# Patient Record
Sex: Female | Born: 1970 | Hispanic: No | Marital: Married
Health system: Northeastern US, Academic
[De-identification: ages and names within clinical notes are randomized; demographics above are authoritative.]

---

## 2013-01-03 IMAGING — CT Abdomen^02_ROUTINE_ABD_PEL (Adult)
1 of 2 series · 15 of 32 positions shown, 19 images · non-contrast
Comparison: None

Final Report

EXAM: CT OF ABDOMEN AND PELVIS
HISTORY: ?Visit reason:  Lt lower quadrant pain R/O hernia;
TECHNIQUE: Helical images images were obtained after the 
administration of oral and intravenous contrast from the 
diaphragmatic domes to the inferior margin of the pubic symphysis.

[Series 3: abdomenpelvis 2.5 b41s · axial · 0.67mm/px · z∈[-492,-59]mm · 15 of 393 slices shown, 19 images]
[im 16/393  soft-tissue]
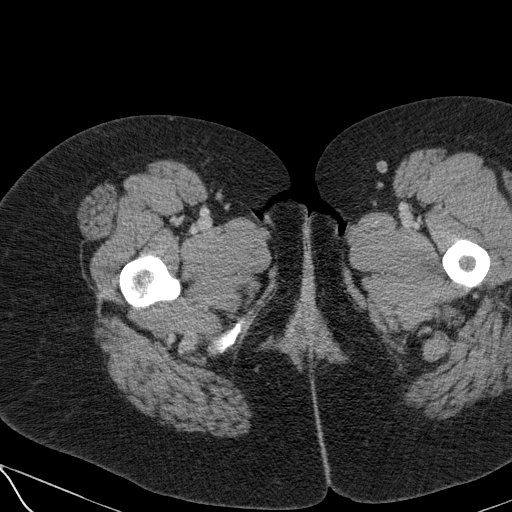
[im 16/393  bone]
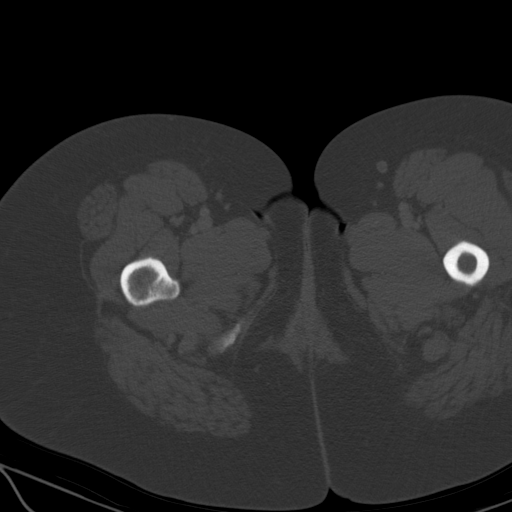
[im 48/393  soft-tissue]
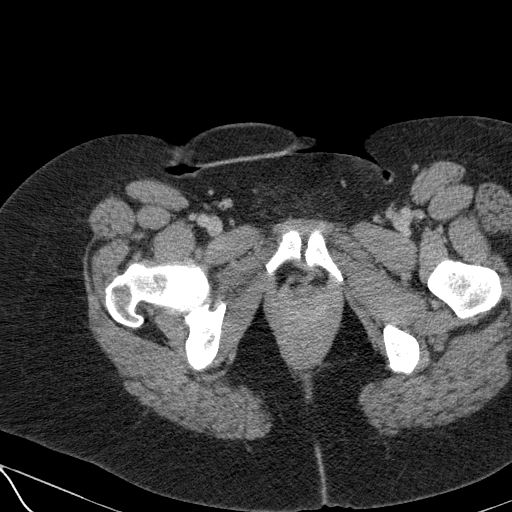
[im 79/393  soft-tissue]
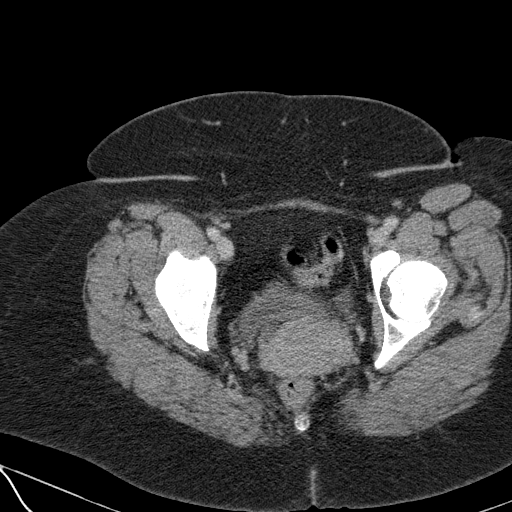
[im 110/393  soft-tissue]
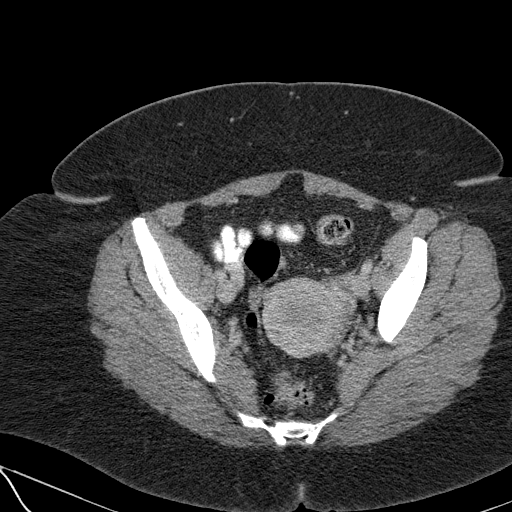
[im 142/393  soft-tissue]
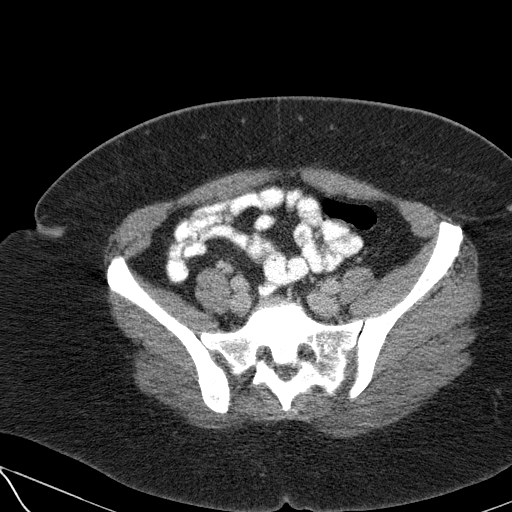
[im 173/393  soft-tissue]
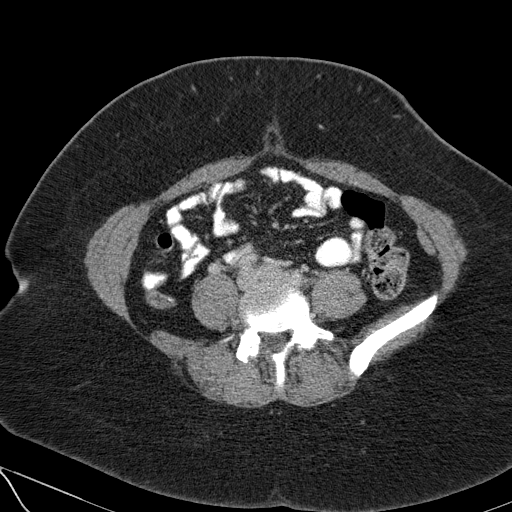
[im 204/393  soft-tissue]
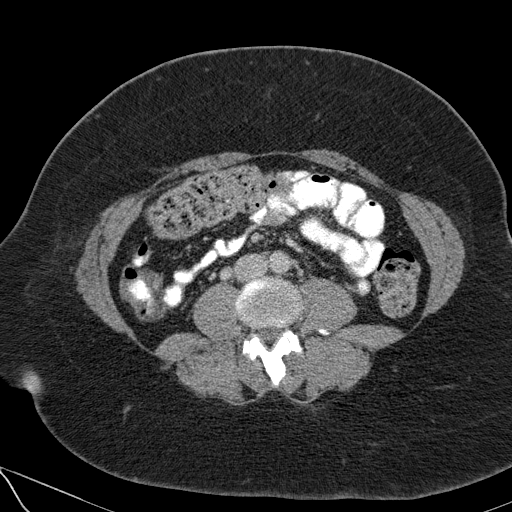
[im 220/393  soft-tissue]
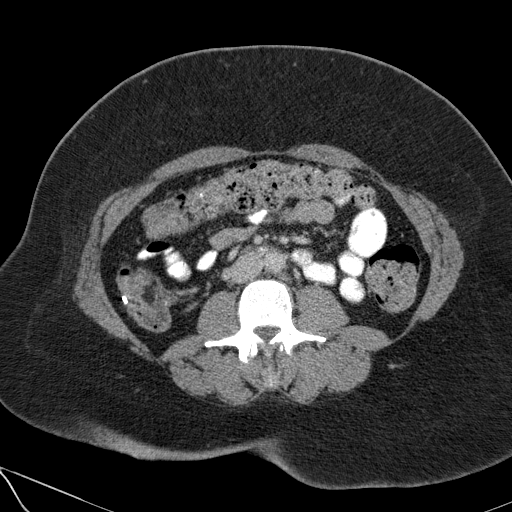
[im 251/393  soft-tissue]
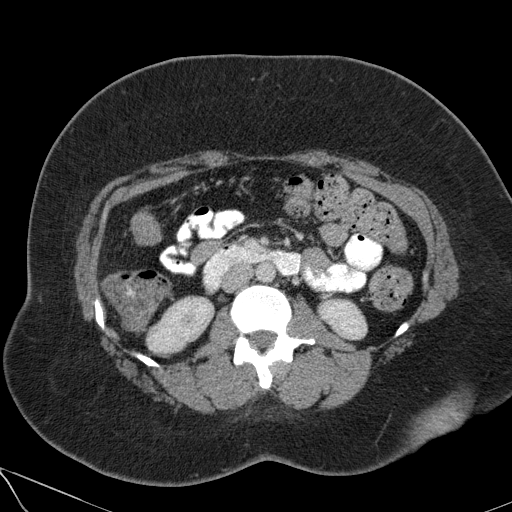
[im 251/393  bone]
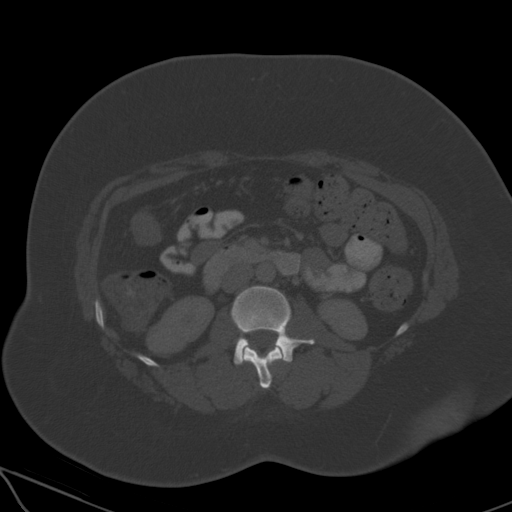
[im 283/393  soft-tissue]
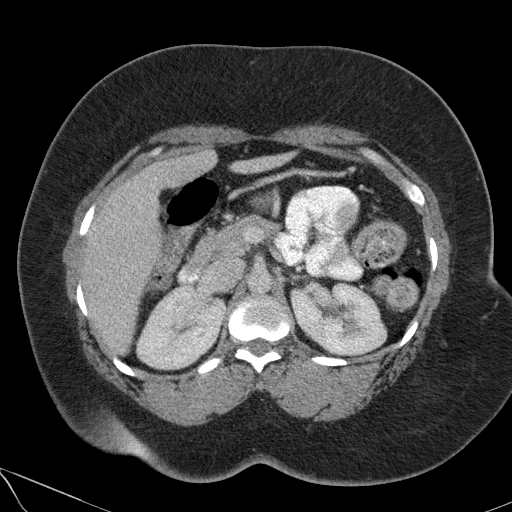
[im 314/393  soft-tissue]
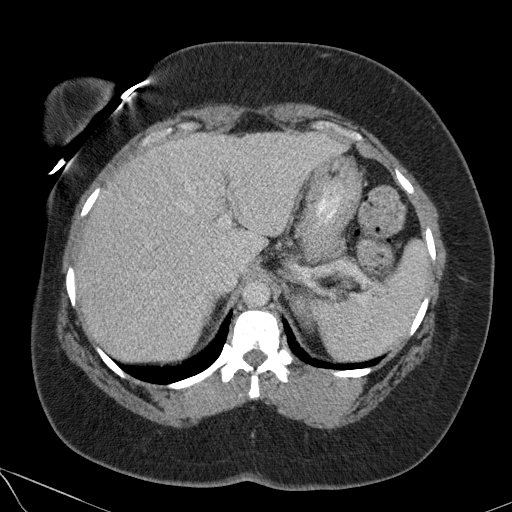
[im 330/393  lung]
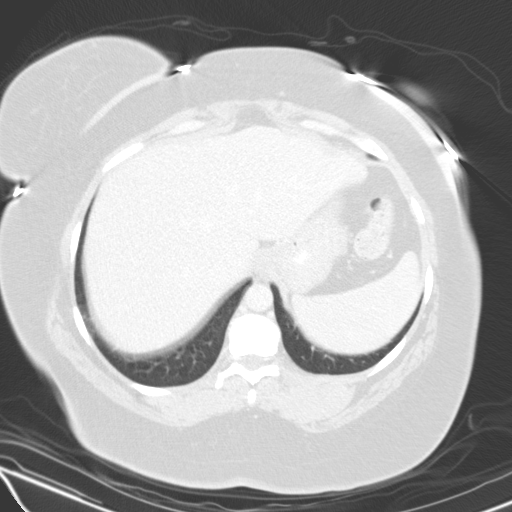
[im 345/393  soft-tissue]
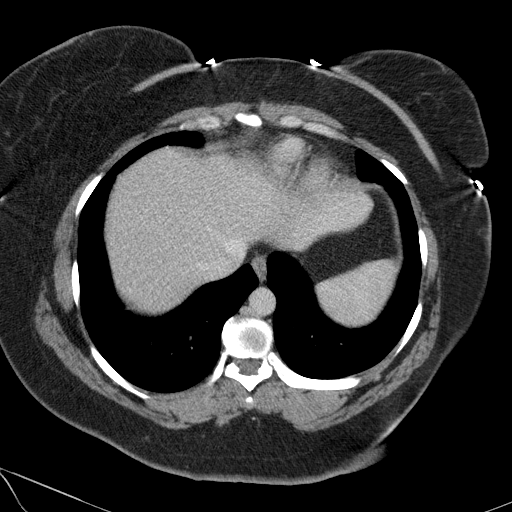
[im 345/393  lung]
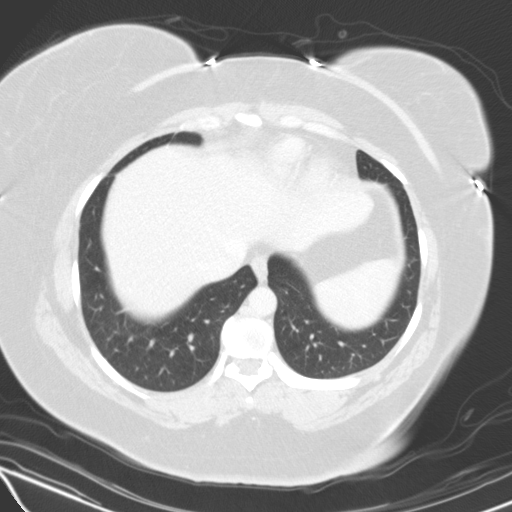
[im 361/393  lung]
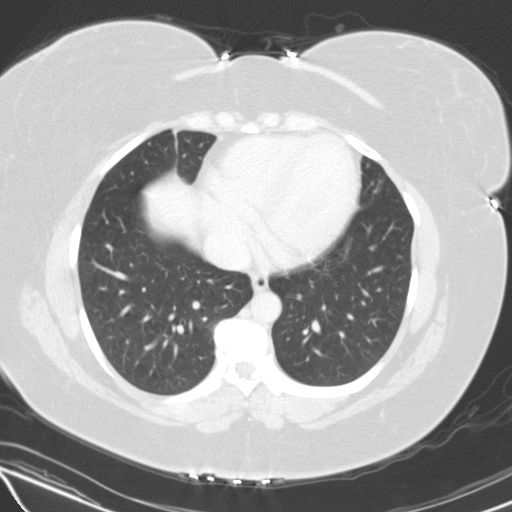
[im 377/393  soft-tissue]
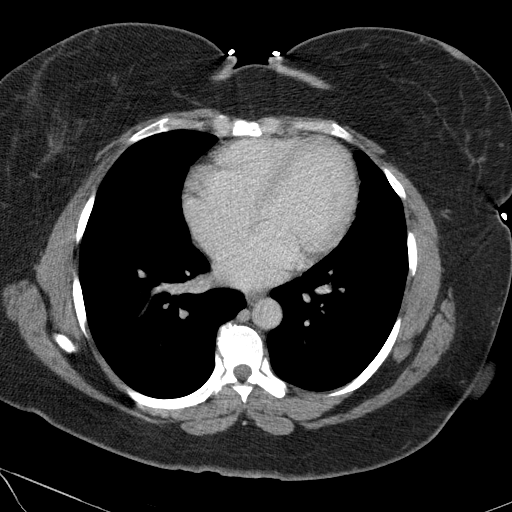
[im 377/393  lung]
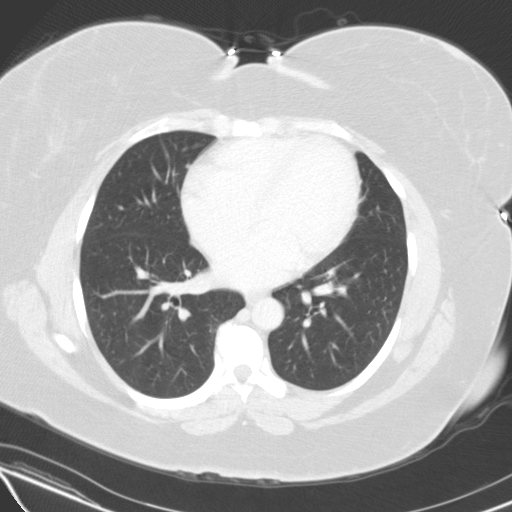

[15 of 32 positions shown; findings below may reference images not displayed]

FINDINGS: Liver: There is no evidence of a solid mass.  The liver has a 

homogeneous architecture. 

Spleen: Unremarkable

Pancreas: Normal in size and architecture.  The pancreatic duct is 

not dilated.

Biliary tree: Cholecystectomy otherwise Unremarkable

Adrenals: Normal in size and architecture.

Kidneys: Normal in size with no evidence of hydronephrosis or solid 

renal mass.  

Lymphadenopathy: None

Peritoneal cavity: No free intraperitoneal fluid or air

Abdominal aorta: Normal in size

2 centimeter left ovarian cyst, 1.5 centimeter right ovarian cyst 

most likely physiologic.

The uterus is unremarkable.

Gastrointestinal tract: No bowel dilatation, focal bowel wall 

thickening, or adjacent inflammatory changes are seen in the 

visualized portions of the gastrointestinal tract.  There is no 

evidence of a hernia
IMPRESSION: Negative study.

## 2020-10-15 ENCOUNTER — Encounter: Admit: 2020-10-15 | Payer: PRIVATE HEALTH INSURANCE | Primary: Internal Medicine

## 2020-10-15 ENCOUNTER — Inpatient Hospital Stay: Admit: 2020-10-15 | Discharge: 2020-10-15 | Payer: Medicaid (Managed Care) | Attending: Emergency Medicine

## 2020-10-15 ENCOUNTER — Emergency Department: Admit: 2020-10-15 | Payer: Medicaid (Managed Care) | Primary: Internal Medicine

## 2020-10-15 DIAGNOSIS — K219 Gastro-esophageal reflux disease without esophagitis: Secondary | ICD-10-CM

## 2020-10-15 DIAGNOSIS — Z79899 Other long term (current) drug therapy: Secondary | ICD-10-CM

## 2020-10-15 DIAGNOSIS — R110 Nausea: Secondary | ICD-10-CM

## 2020-10-15 DIAGNOSIS — R1084 Generalized abdominal pain: Secondary | ICD-10-CM

## 2020-10-15 LAB — CBC WITH AUTO DIFFERENTIAL
BKR WAM ABSOLUTE IMMATURE GRANULOCYTES.: 0.04 x 1000/ÂµL (ref 0.00–0.30)
BKR WAM ABSOLUTE LYMPHOCYTE COUNT.: 1.68 x 1000/ÂµL (ref 0.60–3.70)
BKR WAM ABSOLUTE NRBC (2 DEC): 0 x 1000/ÂµL (ref 0.00–1.00)
BKR WAM ANALYZER ANC: 5.39 x 1000/ÂµL (ref 2.00–7.60)
BKR WAM BASOPHIL ABSOLUTE COUNT.: 0.02 x 1000/ÂµL (ref 0.00–1.00)
BKR WAM BASOPHILS: 0.3 % (ref 0.0–1.4)
BKR WAM EOSINOPHIL ABSOLUTE COUNT.: 0.03 x 1000/ÂµL (ref 0.00–1.00)
BKR WAM EOSINOPHILS: 0.4 % (ref 0.0–5.0)
BKR WAM HEMATOCRIT (2 DEC): 31.7 % — ABNORMAL LOW (ref 35.00–45.00)
BKR WAM HEMOGLOBIN: 10 g/dL — ABNORMAL LOW (ref 11.7–15.5)
BKR WAM IMMATURE GRANULOCYTES: 0.5 % (ref 0.0–1.0)
BKR WAM LYMPHOCYTES: 21.8 % (ref 17.0–50.0)
BKR WAM MCH (PG): 24.5 pg — ABNORMAL LOW (ref 27.0–33.0)
BKR WAM MCHC: 31.5 g/dL (ref 31.0–36.0)
BKR WAM MCV: 77.7 fL — ABNORMAL LOW (ref 80.0–100.0)
BKR WAM MONOCYTE ABSOLUTE COUNT.: 0.55 x 1000/ÂµL (ref 0.00–1.00)
BKR WAM MONOCYTES: 7.1 % (ref 4.0–12.0)
BKR WAM MPV: 10.1 fL (ref 8.0–12.0)
BKR WAM NEUTROPHILS: 69.9 % (ref 39.0–72.0)
BKR WAM NUCLEATED RED BLOOD CELLS: 0 % (ref 0.0–1.0)
BKR WAM PLATELETS: 285 x1000/ÂµL (ref 150–420)
BKR WAM RDW-CV: 15.5 % — ABNORMAL HIGH (ref 11.0–15.0)
BKR WAM RED BLOOD CELL COUNT.: 4.08 M/ÂµL (ref 4.00–6.00)
BKR WAM WHITE BLOOD CELL COUNT: 7.7 x1000/ÂµL (ref 4.0–11.0)

## 2020-10-15 LAB — COMPREHENSIVE METABOLIC PANEL
BKR A/G RATIO: 1
BKR ALANINE AMINOTRANSFERASE (ALT): 17 U/L (ref 12–78)
BKR ALBUMIN: 3.5 g/dL (ref 3.4–5.0)
BKR ALKALINE PHOSPHATASE: 96 U/L (ref 20–120)
BKR ANION GAP: 5 (ref 5–18)
BKR ASPARTATE AMINOTRANSFERASE (AST): 14 U/L (ref 5–37)
BKR AST/ALT RATIO: 0.8
BKR BILIRUBIN TOTAL: 0.3 mg/dL (ref 0.0–1.0)
BKR BLOOD UREA NITROGEN: 8 mg/dL (ref 8–25)
BKR BUN / CREAT RATIO: 10.3 (ref 8.0–25.0)
BKR CALCIUM: 9.1 mg/dL (ref 8.4–10.3)
BKR CHLORIDE: 108 mmol/L (ref 95–115)
BKR CO2: 27 mmol/L (ref 21–32)
BKR CREATININE: 0.78 mg/dL (ref 0.50–1.30)
BKR EGFR (AFR AMER): >60 mL/min/{1.73_m2} (ref >60)
BKR EGFR (NON AFRICAN AMERICAN): >60 mL/min/{1.73_m2} (ref >60)
BKR GLOBULIN: 3.4 g/dL
BKR GLUCOSE: 78 mg/dL (ref 70–100)
BKR OSMOLALITY CALCULATION: 277 mosm/kg (ref 275–295)
BKR POTASSIUM: 3.9 mmol/L (ref 3.5–5.1)
BKR PROTEIN TOTAL: 6.9 g/dL (ref 6.4–8.2)
BKR SODIUM: 140 mmol/L (ref 136–145)

## 2020-10-15 LAB — ZZZURINALYSIS WITH CULTURE REFLEX     (L Q)
BKR BILIRUBIN, UA: NEGATIVE
BKR BLOOD, UA: NEGATIVE
BKR GLUCOSE, UA: NEGATIVE
BKR KETONES, UA: NEGATIVE
BKR LEUKOCYTE ESTERASE, UA: NEGATIVE
BKR NITRITE, UA: NEGATIVE
BKR PH, UA: 7.5 (ref 5.5–7.5)
BKR PROTEIN, UA: NEGATIVE
BKR SPECIFIC GRAVITY, UA: 1.002 — ABNORMAL LOW (ref 1.005–1.030)
BKR UROBILINOGEN, UA: <2 EU/dL (ref <=2.0)

## 2020-10-15 LAB — LIPASE: BKR LIPASE: 99 U/L (ref 73–393)

## 2020-10-15 LAB — HCG, SERUM, QUALITATIVE (BH GH L LMW): BKR SERUM PREGNANCY-QUALITATIVE: NEGATIVE

## 2020-10-15 MED ORDER — ONDANSETRON HCL (PF) 4 MG/2 ML INJECTION SOLUTION
4 mg/2 mL | Freq: Once | INTRAVENOUS | Status: CP
Start: 2020-10-15 — End: ?
  Administered 2020-10-15: 16:00:00 4 mL via INTRAVENOUS

## 2020-10-15 MED ORDER — HYOSCYAMINE 0.125 MG SUBLINGUAL TABLET
0.125 mg | SUBLINGUAL | Status: DC | PRN
Start: 2020-10-15 — End: 2020-10-16
  Administered 2020-10-15: 19:00:00 0.125 mg via SUBLINGUAL

## 2020-10-15 MED ORDER — ONDANSETRON 4 MG DISINTEGRATING TABLET
4 mg | ORAL_TABLET | Freq: Four times a day (QID) | ORAL | 1 refills | Status: AC | PRN
Start: 2020-10-15 — End: ?

## 2020-10-15 MED ORDER — IOHEXOL 350 MG IODINE/ML INTRAVENOUS SOLUTION
350 mg iodine/mL | Freq: Once | INTRAVENOUS | Status: CP | PRN
Start: 2020-10-15 — End: ?
  Administered 2020-10-15: 17:00:00 350 mL via INTRAVENOUS

## 2020-10-15 MED ORDER — OMEPRAZOLE 40 MG CAPSULE,DELAYED RELEASE
40 mg | Freq: Every day | ORAL | Status: AC
Start: 2020-10-15 — End: ?

## 2020-10-15 MED ORDER — SODIUM CHLORIDE 0.9 % BOLUS (NEW BAG)
0.9 % | Freq: Once | INTRAVENOUS | Status: CP
Start: 2020-10-15 — End: ?
  Administered 2020-10-15: 16:00:00 0.9 mL/h via INTRAVENOUS

## 2020-10-15 MED ORDER — HYOSCYAMINE SULFATE 0.125 MG TABLET
0.125 mg | ORAL_TABLET | ORAL | 1 refills | Status: AC | PRN
Start: 2020-10-15 — End: ?

## 2020-10-15 MED ORDER — ALUMINUM-MAG HYDROXIDE-SIMETHICONE 200 MG-200 MG-20 MG/5 ML ORAL SUSP
200-200-20 mg/5 mL | Freq: Once | ORAL | Status: CP
Start: 2020-10-15 — End: ?
  Administered 2020-10-15: 19:00:00 200-200-20 mL via ORAL

## 2020-10-15 NOTE — ED Notes
11:27 AM  - Oral contrast start time

## 2020-10-15 NOTE — ED Notes
3:54 PM - Pt reports pain alleviation states I dont not feel anything right now. Pain score 0/10

## 2020-10-15 NOTE — ED Notes
Victoria Massey was discharged via [employer] accompanied by Spouse.  Verbalized understanding of discharge instructionsand recommended follow up care as per the after visit summary.  Written discharge instructions provided. Denies any further questions. Vital signs    Vitals:  10/15/20 1049 10/15/20 1256 10/15/20 1507 BP: (!) 141/87 (!) 140/72 130/71 Pulse: 78 (!) 54 64 Resp: 18 16 18  Temp: 97.9 ?F (36.6 ?C) 97.7 ?F (36.5 ?C)  TempSrc: Oral Oral  SpO2: 98% 100% 99% Patient confirmed all belongings returned. Belongings charted in last 7 days: No data recorded

## 2020-10-15 NOTE — Discharge Instructions
Plenty of fluidsAvoid spicy/greasy foodsBland dietMay take zofran as needed for nauseaMay take Levsin as needed for abdominal cramps

## 2020-10-15 NOTE — ED Notes
11:02 AM - Presents to the ED for intermittent mid abd pain and suprapubic pain, pain score 5/10. Accompanied by nausea, denies vomiting.  Pt recently traveled from [place] 06[place]22 [place]. Denies fevers, chills, diarrhea, dizziness. Pt awaiting [place]

## 2020-10-15 NOTE — ED Notes
2:25 PM - Provided ginger ale and crackers. Pt tolerating PO intake in the ED

## 2020-10-16 NOTE — Telephone Encounter
Not able to get through.

## 2020-10-20 NOTE — ED Provider Notes
? 2014 CD-Notes ?  [employer] [facility] Emergency Department	Chief Complaint:Chief Complaint Patient presents with ? Abdominal Pain   BIBA for mid abd  and suprapubic pain a since this morning accompanied by nausea, denies vomiting. Last BM this morning formed. Denies fevers/ chills. Hx of gastric bypass  (2018) and excess skin removal.  ZOX:WRUEAVW Victoria Massey, 50 y.o., female, presents with abdominal pain starting in the upper abdomen and progressing to lower abdomen since this morning. The pain is associated with mild nausea, no vomiting or diarrhea. Patient is s/p gastric bypass procedure in 2018 with no previous issues or complications. Pt denies fevers, chills, chest pain, shortness of breath, or any other acute complaints. Historian: patientReview of Systems:	Constitution: 	Denies: fever, chills, weakness, fatigue	HENT:		Denies: sore throat, nasal congestion, nasal discharge	EYES:		Denies: blurry vision, decreased vision	RESP:		Denies: cough, shortness of breath	CARDIO:	Denies: chest pain, dyspnea on exertion, palpitations	GI:		+abdominal pain, nausea; Denies: vomiting, diarrhea	GU:		Denies: dysuria	MUSC:		Denies: back pain, joint pain	SKIN:		Denies: rash	NEURO:	Denies: headache, numbness/tinglingPast Medical History:Past Medical History: Diagnosis Date ? GERD (gastroesophageal reflux disease)  Past Surgical History: Procedure Laterality Date ? CHOLECYSTECTOMY  2009 ? GASTRIC BYPASS  2018 Social History:Social History Tobacco Use ? Smoking status: Never Smoker ? Smokeless tobacco: Never Used Substance Use Topics ? Alcohol use: Yes   Comment: occaisonaly Family History:No family history on file.Medications: Medication List  START taking these medications  hyoscyamine 0.125 mg tabletCommonly known as: ANASPAZ,LEVSINTake 1 tablet (0.125 mg total) by mouth every 4 (four) hours as needed for cramping for up to 10 days. ondansetron 4 mg disintegrating tabletCommonly known as: ZOFRAN-ODTTake 1 tablet (4 mg total) by mouth every 6 (six) hours as needed for nausea for up to 7 days.  ASK your doctor about these medications  omeprazole 40 mg capsuleCommonly known as: PriLOSEC   Where to Get Your Medications  These medications were sent to [facility] - [place], [place] - [address]  [address] [place], [place] [address]  Phone: [phone] ?	hyoscyamine 0.125 mg tablet?	ondansetron 4 mg disintegrating tablet Allergies as of 10/15/2020 ? (No Known Allergies) Physical Exam:  ED Triage Vitals [10/15/20 1049]BP: (!) 141/87Pulse: 78Pulse from  O2 sat: n/aResp: 18Temp: 97.9 ?F (36.6 ?C)Temp src: OralSpO2: 98 % General Appear:	Alert and oriented x3, in no acute distressEyes:			PERRL, anictericCardiovascular:	regular rate and rhythm, no murmurRespiratory:		clear to auscultation bilaterally without wheeze, rales or ronchiAbdomen:		Diffuse abdominal tenderness greatest in the epigastrium and LLQ, no rebound or guarding Musculoskeletal:	Without edema Skin:			no rash, skin dryNeurologic:		Nonfocal 	Medical Decision Making:Nursing notes were reviewed: YesLaboratory studies: YesResults for orders placed or performed during the hospital encounter of 10/15/20 Lipase Result Value Ref Range  Lipase 99 73 - 393 U/L Urinalysis with culture reflex  Specimen: Urine, Clean Catch Result Value Ref Range  Clarity, UA Clear Clear  Color, UA Colorless Yellow  Specific Gravity, UA 1.002 (L) 1.005 - 1.030  pH, UA 7.5 5.5 - 7.5  Protein, UA Negative Negative-Trace  Glucose, UA Negative Negative  Ketones, UA Negative Negative  Blood, UA Negative Negative  Bilirubin, UA Negative Negative  Leukocytes, UA Negative Negative  Nitrite, UA Negative Negative  Urobilinogen, UA <2.0 <=2.0 EU/dL hCG, serum, qualitative  (females 10-60 yrs old unless hysterectomy) Result Value Ref Range  Serum Pregnancy-Qualitative Negative Negative CBC auto differential Result Value Ref Range  WBC 7.7 4.0 - 11.0 x1000/?L  RBC 4.08 4.00 - 6.00 M/?L  Hemoglobin 10.0 (L) 11.7 - 15.5 g/dL  Hematocrit 13.08 (L) 65.78 - 45.00 %  MCV 77.7 (L) 80.0 - 100.0 fL  MCH 24.5 (L) 27.0 - 33.0 pg  MCHC 31.5 31.0 - 36.0 g/dL  RDW-CV 84.6 (H) 96.2 - 15.0 %  Platelets 285 150 - 420 x1000/?L  MPV 10.1 8.0 - 12.0 fL  Neutrophils 69.9 39.0 - 72.0 %  Lymphocytes 21.8 17.0 - 50.0 %  Monocytes 7.1 4.0 - 12.0 %  Eosinophils 0.4 0.0 - 5.0 %  Basophil 0.3 0.0 - 1.4 %  Immature Granulocytes 0.5 0.0 - 1.0 %  nRBC 0.0 0.0 - 1.0 %  ANC(Abs Neutrophil Count) 5.39 2.00 - 7.60 x 1000/?L  Absolute Lymphocyte Count 1.68 0.60 - 3.70 x 1000/?L  Monocyte Absolute Count 0.55 0.00 - 1.00 x 1000/?L  Eosinophil Absolute Count 0.03 0.00 - 1.00 x 1000/?L  Basophil Absolute Count 0.02 0.00 - 1.00 x 1000/?L  Absolute Immature Granulocyte Count 0.04 0.00 - 0.30 x 1000/?L  Absolute nRBC 0.00 0.00 - 1.00 x 1000/?L Comprehensive metabolic panel Result Value Ref Range  Sodium 140 136 - 145 mmol/L  Potassium 3.9 3.5 - 5.1 mmol/L  Chloride 108 95 - 115 mmol/L  CO2 27 21 - 32 mmol/L  Anion Gap 5 5 - 18  Glucose 78 70 - 100 mg/dL  BUN 8 8 - 25 mg/dL  Creatinine 9.52 8.41 - 1.30 mg/dL  Calcium 9.1 8.4 - 32.4 mg/dL  BUN/Creatinine Ratio 40.1 8.0 - 25.0  Total Protein 6.9 6.4 - 8.2 g/dL  Albumin 3.5 3.4 - 5.0 g/dL  Total Bilirubin 0.3 0.0 - 1.0 mg/dL  Alkaline Phosphatase 96 20 - 120 U/L  Alanine Aminotransferase (ALT) 17 12 - 78 U/L  Aspartate Aminotransferase (AST) 14 5 - 37 U/L  Globulin 3.4 g/dL  A/G Ratio 1.0   AST/ALT Ratio 0.8 See Comment  eGFR (Afr Amer) >60 >60 mL/min/1.13m2  eGFR (NON African-American) >60 >60 mL/min/1.72m2  Osmolality Calculation 277 275 - 295 mOsm/kg EKG (if patient > 52 years old) Result Value Ref Range  Heart Rate 58 bpm  QRS Duration 78 ms  Q-T Interval 402 ms  QTC Calculation(Bezet) 394 ms  P Axis 49 deg  R Axis 42 deg  T Axis 48 deg  P-R Interval 148 msec  ECG - SEVERITY Otherwise Normal ECG severity Radiologic Imaging studies: [place] Abdomen Pelvis w IV Contrast (Oral) Final Result No specific findings to account for the patient's abdominal pain. Mild constipation. Postoperative changes of prior gastric bypass.  Reported and Signed by:  Azzie Glatter, MD   CARDIAC EKG RESULT SCAN Final Result  		EKG: 	NSR without acute ischemic changes Independently interpreted by ED provider: labs/EKG/RadDiscussed patient with another provider: NoSummary of ED Vital Signs: Vitals:  10/15/20 1049 10/15/20 1256 10/15/20 1507 BP: (!) 141/87 (!) 140/72 130/71 Pulse: 78 (!) 54 64 Resp: 18 16 18  Temp: 97.9 ?F (36.6 ?C) 97.7 ?F (36.5 ?C)  TempSrc: Oral Oral  SpO2: 98% 100% 99%  ED Course/Assessment/Plan:Victoria Massey, 50 y.o., female, with epigastric and lower abdominal pain. -labs, EKG-[place] Abd/Pel to r/o diverticulitis versus obstruction-IV Fluids, Zofran 4 mg IV-reassessPt feeling betterLabs wnlCT without evidence for obstruction or diverticulitisWill d/c home with rx for zofran and levsin F/u with PMD and bariatric surgeon if pain persists______________________________________________________________________Diagnosis: The encounter diagnosis was Generalized abdominal pain.Disposition: HomeCondition: StableBy signing my name below, I, French Ana, attest that this documentation has been prepared under the direction and in the presence of Shaul Trautman, MD Electronically Signed: French Ana, scribe. 10/15/2020. 11:53 AM.I, Maynor Mwangi, Vanessa Kick, MD, personally performed the services described in this documentation. All medical record entries made by the scribe were at my direction and in my  presence. I have reviewed the chart and discharge instructions and agree that the record reflects my personal performance and is accurate and complete. Biviana Saddler, MD. 10/15/2020. 11:53 AM.?2014 CD-Notes?  LLC All Rights Reserved; version 2.0; revised November, 2018.[email or web address] Belissa Kooy, MD07/13/22 1734
